# Patient Record
Sex: Male | Born: 1985 | Race: Black or African American | Hispanic: No | Marital: Single | Smoking: Never smoker
Health system: Southern US, Community
[De-identification: ages and names within clinical notes are randomized; demographics above are authoritative.]

---

## 2017-10-22 ENCOUNTER — Other Ambulatory Visit: Payer: Self-pay

## 2017-10-22 ENCOUNTER — Encounter (HOSPITAL_COMMUNITY): Payer: Self-pay | Admitting: Emergency Medicine

## 2017-10-22 ENCOUNTER — Ambulatory Visit (HOSPITAL_COMMUNITY)
Admission: EM | Admit: 2017-10-22 | Discharge: 2017-10-22 | Disposition: A | Discharge status: Home / Self Care | Payer: Self-pay | Attending: Urgent Care | Admitting: Urgent Care

## 2017-10-22 DIAGNOSIS — R21 Rash and other nonspecific skin eruption: Secondary | ICD-10-CM

## 2017-10-22 DIAGNOSIS — L299 Pruritus, unspecified: Secondary | ICD-10-CM

## 2017-10-22 DIAGNOSIS — B86 Scabies: Secondary | ICD-10-CM

## 2017-10-22 MED ORDER — HYDROXYZINE HCL 25 MG PO TABS: 25.0000 mg | ORAL_TABLET | Freq: Four times a day (QID) | ORAL | 0 refills | Status: DC

## 2017-10-22 MED ORDER — IVERMECTIN 3 MG PO TABS: ORAL_TABLET | ORAL | 0 refills | Status: DC

## 2017-10-22 NOTE — ED Provider Notes (Signed)
  MRN: [id number] DOB: 10/03/1985  Subjective:   Cody Oconnell is a 32 y.o. male presenting for 3 day history of itching, rash that started over his right wrist, moved up his forearm. Now he has the rash over his left forearm, torso, lower legs. Denies fever, rash or itching over hands, soles of feet, headache, sore throat, cough, chest tightness, shob, n/v, abdominal pain. Lives with 1 roommate that does not have any symptoms. He does not work outdoors, has not come into contact with poisonous plants, no tick bites. Has not started eating new foods, using new cleaning products or hygiene products. Denies smoking cigarettes or drinking alcohol. He is not currently taking any medications. Patient has No Known Allergies and denies past medical and surgical history.   Objective:   Vitals: BP 134/83 (BP Location: Left Arm)   Pulse 64   Temp 98 F (36.7 C) (Oral)   Resp 18   Wt 142 lb 6.4 oz (64.6 kg)   SpO2 100%   Physical Exam  Constitutional: He is oriented to person, place, and time. He appears well-developed and well-nourished.  HENT:  Mouth/Throat: Oropharynx is clear and moist.  Eyes: Right eye exhibits no discharge. Left eye exhibits no discharge.  Neck: Normal range of motion. Neck supple.  Cardiovascular: Normal rate, regular rhythm and intact distal pulses. Exam reveals no gallop and no friction rub.  No murmur heard. Pulmonary/Chest: No respiratory distress. He has no wheezes. He has no rales.  Lymphadenopathy:    He has no cervical adenopathy.  Neurological: He is alert and oriented to person, place, and time.  Skin: Skin is warm and dry. Rash (multiple linear excoriations and burrows over forearms bilaterally, lower legs and left torso) noted.  Psychiatric: He has a normal mood and affect.   Assessment and Plan :   Scabies  Rash  Itching  Will manage as scabies with Ivermectin. Use hydroxyzine for itching. Return-to-clinic precautions discussed, patient  verbalized understanding.    Wallis BambergMani, Cody Oconnell, [place]PA-C 10/22/17 [id number]

## 2017-10-22 NOTE — ED Triage Notes (Signed)
Rash and itching for 3 days

## 2017-10-30 ENCOUNTER — Other Ambulatory Visit: Payer: Self-pay

## 2017-10-30 ENCOUNTER — Other Ambulatory Visit (HOSPITAL_COMMUNITY)
Admission: RE | Admit: 2017-10-30 | Discharge: 2017-10-30 | Disposition: A | Discharge status: Home / Self Care | Payer: Medicaid Other | Source: Ambulatory Visit | Attending: Family Medicine | Admitting: Family Medicine

## 2017-10-30 ENCOUNTER — Ambulatory Visit (INDEPENDENT_AMBULATORY_CARE_PROVIDER_SITE_OTHER): Payer: Self-pay | Admitting: Family Medicine

## 2017-10-30 VITALS — BP 120/72 | HR 69 | Temp 98.5°F | Ht 72.0 in | Wt 144.4 lb

## 2017-10-30 DIAGNOSIS — Z0289 Encounter for other administrative examinations: Secondary | ICD-10-CM

## 2017-10-30 DIAGNOSIS — Z789 Other specified health status: Secondary | ICD-10-CM | POA: Insufficient documentation

## 2017-10-30 DIAGNOSIS — H547 Unspecified visual loss: Secondary | ICD-10-CM

## 2017-10-30 DIAGNOSIS — R630 Anorexia: Secondary | ICD-10-CM

## 2017-10-30 DIAGNOSIS — R309 Painful micturition, unspecified: Secondary | ICD-10-CM

## 2017-10-30 MED ORDER — AZITHROMYCIN 500 MG PO TABS
1000.0000 mg | ORAL_TABLET | Freq: Once | ORAL | Status: AC
  Administered 2017-10-30: 1000 mg via ORAL

## 2017-10-30 MED ORDER — CEFTRIAXONE SODIUM 250 MG IJ SOLR
250.0000 mg | Freq: Once | INTRAMUSCULAR | Status: AC
  Administered 2017-10-30: 250 mg via INTRAMUSCULAR

## 2017-10-30 NOTE — Patient Instructions (Signed)
Thank you for coming in!  We will do some lab tests today.   We will also treat you for your pain with urination.   We made an follow up appointment for you in two weeks.

## 2017-10-30 NOTE — Progress Notes (Signed)
Kinyarwanda interpreter [place] [phone]) utilized during today's visit. Interpreter present for entire patient encounter.   [facility]  HPI:  Patient presents to [facility] today for a new patient appointment to establish general primary care, also to discuss dysuria, blurred vision, decreased appetite.   1. Dysuria: reports of dysuria for 2-3 months. This is intermittent. Denies hematuria. No fevers or chills. No abdominal pain. Feels like he empties his bladder well. No penile discharge. Is sexually active with women. Uses condoms irregularly   2. Blurred Vision: this is a chronic issue. Has difficulty seeing far away. More recently, has difficulty seeing the board at school. Does not have glasses. Used his teacher's glasses once and was able to see better. Denies any eye pain or floaters.   3. Decreased Appetite: reports that he does not usually eat much in general. This has been ongoing for years. Denies nausea, burning sensation in his chest, abdominal pain, constipation. Denies feelings of depression or anhedonia. Does not think he has lost weight. No night sweats.   ROS:  otherwise see HPI  Past Medical Hx:  - none  Past Surgical Hx:  - none  Family Hx: updated in Epic - Number of family members:  5 - Number of family members in [place]:  4  Immigrant Social History: - Date arrived in [place]: March 2019 - Country of origin: [place] - Location of refugee camp (if applicable), how long there, and what caused patient to leave home country?: [place] since 1996 due to war in [place] - Primary language: Kinyarwanda  -Requires intepreter (essentially speaks no [place]) - Education: Highest level of education: primary school. His lost his father at a young age. He had to take care of his family.  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  no    Preventative Care History: -Seen at health department?: no  PHYSICAL EXAM: BP 120/72   Pulse 69   Temp 98.5 F (36.9 C) (Oral)   Ht 6' (1.829 m)   Wt 144 lb 6.4 oz (65.5 kg)   SpO2 99%   BMI 19.58 kg/m  Gen: NAD, pleasant  HEENT: Atraumatic, normocephalic, neck supple, EOMI, conjunctiva mildly injected bilaterally, PERRL. Oropharynx normal.  Neck:  Supple and no lymphadenopathy  Heart: RRR, no murmurs, rubs, or gallops Lungs: CTAB, normal effort  ABD: Soft, nontender, nondistended, NABS, no organomegaly GU: uncircumcised; normal anatomy. No abnormalities  Skin:  No rash  MSK: able to move all extremities without difficulty  Neuro: Awake, alert, no focal deficits grossly, normal speech Psych: Mood and affect euthymic, normal rate and volume of speech  Examined and interviewed with Dr. Gwendolyn Grant  1. Refugee health examination - Comprehensive metabolic panel - CBC with Differential/Platelet - Urine cytology ancillary only - Varicella zoster antibody, IgG - Hepatitis B surface antigen - Hepatitis C antibody - QuantiFERON-TB Gold Plus - Sickle cell screen - RPR - HIV antibody (with reflex)  2. Language barrier  3. Urinary pain Will obtain UA and Gc/Chlamydia testing as noted above. Likely STI related therefore will treat empirically with Azithromycin 1g PO X1  and Ceftriaxone 250mg  IM x 1. Follow up appointment made to review labs.  - azithromycin (ZITHROMAX) tablet 1,000 mg - cefTRIAXone (ROCEPHIN) injection 250 mg  4.  Decreased Vision:  Visual acuity 20/30. Patient reports he is failure certain he has an appointment with eye doctor. He will let us know.   5. Decreased Appetite:  Appears to be chronic. Exam is unremarkable. Will obtain basic labs as noted above. Does not seem to be related to mood, GERD, gastritis, or constipation.

## 2017-10-31 LAB — URINE CYTOLOGY ANCILLARY ONLY
Chlamydia: NEGATIVE
Neisseria Gonorrhea: NEGATIVE

## 2017-11-02 ENCOUNTER — Encounter: Payer: Self-pay | Admitting: Family Medicine

## 2017-11-02 LAB — CBC WITH DIFFERENTIAL/PLATELET
BASOS ABS: 0 10*3/uL (ref 0.0–0.2)
BASOS: 0 %
EOS (ABSOLUTE): 0.1 10*3/uL (ref 0.0–0.4)
Eos: 2 %
Hematocrit: 47.1 % (ref 37.5–51.0)
Hemoglobin: 16.5 g/dL (ref 13.0–17.7)
IMMATURE GRANS (ABS): 0 10*3/uL (ref 0.0–0.1)
Immature Granulocytes: 1 %
LYMPHS: 41 %
Lymphocytes Absolute: 2.5 10*3/uL (ref 0.7–3.1)
MCH: 30.2 pg (ref 26.6–33.0)
MCHC: 35 g/dL (ref 31.5–35.7)
MCV: 86 fL (ref 79–97)
MONOS ABS: 0.3 10*3/uL (ref 0.1–0.9)
Monocytes: 6 %
NEUTROS ABS: 3 10*3/uL (ref 1.4–7.0)
NEUTROS PCT: 50 %
Platelets: 259 10*3/uL (ref 150–379)
RBC: 5.47 x10E6/uL (ref 4.14–5.80)
RDW: 14.2 % (ref 12.3–15.4)
WBC: 6 10*3/uL (ref 3.4–10.8)

## 2017-11-02 LAB — COMPREHENSIVE METABOLIC PANEL
A/G RATIO: 1.6 (ref 1.2–2.2)
ALT: 29 IU/L (ref 0–44)
AST: 21 IU/L (ref 0–40)
Albumin: 4.6 g/dL (ref 3.5–5.5)
Alkaline Phosphatase: 71 IU/L (ref 39–117)
BILIRUBIN TOTAL: 0.6 mg/dL (ref 0.0–1.2)
BUN/Creatinine Ratio: 11 (ref 9–20)
BUN: 10 mg/dL (ref 6–20)
CHLORIDE: 101 mmol/L (ref 96–106)
CO2: 25 mmol/L (ref 20–29)
Calcium: 9.3 mg/dL (ref 8.7–10.2)
Creatinine, Ser: 0.93 mg/dL (ref 0.76–1.27)
GFR, EST AFRICAN AMERICAN: 127 mL/min/{1.73_m2} (ref >59)
GFR, EST NON AFRICAN AMERICAN: 110 mL/min/{1.73_m2} (ref >59)
GLOBULIN, TOTAL: 2.9 g/dL (ref 1.5–4.5)
Glucose: 99 mg/dL (ref 65–99)
Potassium: 4.5 mmol/L (ref 3.5–5.2)
SODIUM: 139 mmol/L (ref 134–144)
Total Protein: 7.5 g/dL (ref 6.0–8.5)

## 2017-11-02 LAB — HEPATITIS B SURFACE ANTIGEN: HEP B S AG: NEGATIVE

## 2017-11-02 LAB — QUANTIFERON-TB GOLD PLUS
QuantiFERON Mitogen Value: >10 IU/mL
QuantiFERON Nil Value: 5.84 IU/mL
QuantiFERON-TB Gold Plus: POSITIVE — AB

## 2017-11-02 LAB — VARICELLA ZOSTER ANTIBODY, IGG: VARICELLA: 1361 {index} (ref >165)

## 2017-11-02 LAB — SICKLE CELL SCREEN: Sickle Cell Screen: NEGATIVE

## 2017-11-02 LAB — HEPATITIS C ANTIBODY

## 2017-11-02 LAB — HIV ANTIBODY (ROUTINE TESTING W REFLEX): HIV Screen 4th Generation wRfx: NONREACTIVE

## 2017-11-02 LAB — RPR: RPR Ser Ql: NONREACTIVE

## 2017-11-02 NOTE — Progress Notes (Unsigned)
Patient with positive quantiferon-gold.  Needs to have this letter faxed to health department at TB clinic fax number:  [phone].  Please fax this to the above number.  He also needs a face-sheet with his DOB included. Thanks!

## 2017-11-13 ENCOUNTER — Ambulatory Visit: Payer: Self-pay | Admitting: Internal Medicine

## 2017-11-13 NOTE — Progress Notes (Deleted)
   [facility] Phone: [phone]   Date of Visit: 11/13/2017   HPI:  ***  ROS: See HPI.  PMFSH: ***  PHYSICAL EXAM: There were no vitals taken for this visit. Gen: *** HEENT: *** Heart: *** Lungs: *** Neuro: *** Ext: ***  ASSESSMENT/PLAN:  Health maintenance:  -***  No problem-specific Assessment & Plan notes found for this encounter.  FOLLOW UP: Follow up in *** for ***  Palma Holter, MD PGY 2 [facility] '

## 2017-11-21 ENCOUNTER — Other Ambulatory Visit: Payer: Self-pay

## 2017-11-21 ENCOUNTER — Ambulatory Visit (INDEPENDENT_AMBULATORY_CARE_PROVIDER_SITE_OTHER): Payer: Medicaid Other | Admitting: Internal Medicine

## 2017-11-21 ENCOUNTER — Encounter: Payer: Self-pay | Admitting: Internal Medicine

## 2017-11-21 VITALS — BP 110/70 | HR 58 | Temp 98.4°F | Ht 72.0 in | Wt 143.0 lb

## 2017-11-21 DIAGNOSIS — J302 Other seasonal allergic rhinitis: Secondary | ICD-10-CM

## 2017-11-21 DIAGNOSIS — Z789 Other specified health status: Secondary | ICD-10-CM | POA: Diagnosis not present

## 2017-11-21 DIAGNOSIS — R7611 Nonspecific reaction to tuberculin skin test without active tuberculosis: Secondary | ICD-10-CM | POA: Diagnosis not present

## 2017-11-21 DIAGNOSIS — R12 Heartburn: Secondary | ICD-10-CM | POA: Diagnosis not present

## 2017-11-21 DIAGNOSIS — Z227 Latent tuberculosis: Secondary | ICD-10-CM

## 2017-11-21 MED ORDER — CETIRIZINE HCL 10 MG PO TABS: 10.0000 mg | ORAL_TABLET | Freq: Every day | ORAL | 0 refills | Status: AC

## 2017-11-21 MED ORDER — RANITIDINE HCL 75 MG PO TABS: 75.0000 mg | ORAL_TABLET | Freq: Two times a day (BID) | ORAL | 0 refills | Status: AC

## 2017-11-21 NOTE — Progress Notes (Signed)
   [facility] Phone: [phone]   Date of Visit: 11/21/2017   HPI:  Follow up to discuss labs:  - we discussed his positive quant gold testing. He denies any cough, blood in sputum, or night sweats.  - reports that when he was in his country, he had a room mate who was having cough and night sweats.  - he did not hear from the health department regarding this.   Heart Burn:  - patient reports that he sometimes has burning in his chest sometimes after he eats  - he denies any epigastric pain but reports he had some abdominal pain when he was a kid  Sneezing:  - reports that for the past few weeks he has been having sneezing, itchy nose, and itchy eyes. His throat is itchy  - no fevers or chills - no sick contacts   Uncircumcised Male:  - at the initial visit, patient reported that he desires to have circumcision   ROS: See HPI.  PMFSH:  PMH:  Latent TB  PHYSICAL EXAM: BP 110/70   Pulse (!) 58   Temp 98.4 F (36.9 C) (Oral)   Ht 6' (1.829 m)   Wt 143 lb (64.9 kg)   SpO2 99%   BMI 19.39 kg/m  GEN: NAD HEENT: Atraumatic, normocephalic, neck supple, EOMI, sclera clear, pharynx is mildly erythematous without exudates. Nasal turbinates erythematous   CV: RRR, no murmurs, rubs, or gallops PULM: CTAB, normal effort SKIN: No rash or cyanosis; warm and well-perfused EXTR: No lower extremity edema or calf tenderness PSYCH: Mood and affect euthymic, normal rate and volume of speech NEURO: Awake, alert, no focal deficits grossly, normal speech;  ASSESSMENT/PLAN:  1. Latent tuberculosis by blood test Discussed with Dr. Gwendolyn Grant. No symptoms of active TB. Dr. Gwendolyn Grant to resend letter to health department.   2. Uncircumcised male Patient desires to have circumcision.  - Ambulatory referral to Urology  3. Heart burn No red flags. No signs of PUD. Trial of Zantac  BID. Follow up in 3-4 weeks.   4. Seasonal allergies No signs of infectious  process. Trial of Zyrtec  q hs. Follow up in 3-4 weeks.   Palma Holter, MD PGY 3 [facility]]

## 2017-11-21 NOTE — Progress Notes (Signed)
Printed letter with lab results of + quant gold to send to Health Department re: latent TB.  Unclear if they received prior letter.  Placed in "to be faxed" box.

## 2017-11-21 NOTE — Patient Instructions (Addendum)
1) I will refer you to the urology for the procedure  2) Please try Zantac 1 tablet twice a day for heart burn.  3) I prescribed Zyrtec 1 tablet at night for allergies  4) Follow up in 3-4 weeks

## 2017-12-18 ENCOUNTER — Ambulatory Visit: Payer: Medicaid Other | Admitting: Family Medicine

## 2018-02-19 ENCOUNTER — Other Ambulatory Visit: Payer: Self-pay

## 2018-02-19 ENCOUNTER — Encounter: Payer: Self-pay | Admitting: Family Medicine

## 2018-02-19 ENCOUNTER — Ambulatory Visit (INDEPENDENT_AMBULATORY_CARE_PROVIDER_SITE_OTHER): Payer: Medicaid Other | Admitting: Family Medicine

## 2018-02-19 VITALS — BP 122/68 | HR 50 | Temp 97.5°F | Ht 72.0 in | Wt 154.0 lb

## 2018-02-19 DIAGNOSIS — K648 Other hemorrhoids: Secondary | ICD-10-CM

## 2018-02-19 DIAGNOSIS — M62838 Other muscle spasm: Secondary | ICD-10-CM

## 2018-02-19 DIAGNOSIS — R7611 Nonspecific reaction to tuberculin skin test without active tuberculosis: Secondary | ICD-10-CM | POA: Diagnosis not present

## 2018-02-19 DIAGNOSIS — Z227 Latent tuberculosis: Secondary | ICD-10-CM

## 2018-02-19 DIAGNOSIS — R0689 Other abnormalities of breathing: Secondary | ICD-10-CM

## 2018-02-19 MED ORDER — HYDROCORTISONE ACETATE 25 MG RE SUPP: 25.0000 mg | Freq: Two times a day (BID) | RECTAL | 0 refills | Status: AC

## 2018-02-19 NOTE — Progress Notes (Signed)
Subjective:    Cody Oconnell is a 32 y.o. male [facility] and other concerns:  1.  Right shoulder pain:  Present for about a week.  Worsened while he is at work.  He works at [employer].  He is right handed.  Aching pain in trapezius region on Right side.  No falls.  No injuries.  Hasn't tried anything for relief.  No radiation.  No neck pain.  2.  Blood when wiping:  Intermittent, though most days.  Present when wiping.  Noted for past several weeks.  Never any blood in toilet bowel.  Has had trouble with constipation.  Also itching in rectum.  Doesn't know what to do for relief.  No lightheadedness.  3.  Breathing trouble:  Occurs at work.  Works in ~45 degree room at [employer].  Was told it could be secondary to energy drinks he uses at work.  Only occurs in cold at work.  Never with other exertions.  No chest pain.  No cough.  No fever/chills.  Hasn't tried anything for relief.    ROS as above per HPI.   The following portions of the patient's history were reviewed and updated as appropriate: allergies, current medications, past medical history, family and social history, and problem list. Patient is a nonsmoker.    PMH reviewed.  No past medical history on file. No past surgical history on file.  Medications reviewed. Current Outpatient Medications  Medication Sig Dispense Refill  . cetirizine (ZYRTEC) 10 MG tablet Take 1 tablet (10 mg total) by mouth daily. 30 tablet 0  . ranitidine (ZANTAC) 75 MG tablet Take 1 tablet (75 mg total) by mouth 2 (two) times daily. 60 tablet 0   No current facility-administered medications for this visit.      Objective:   Physical Exam BP 122/68   Pulse (!) 50   Temp (!) 97.5 F (36.4 C) (Oral)   Ht 6' (1.829 m)   Wt 154 lb (69.9 kg)   SpO2 99%   BMI 20.89 kg/m  Gen:  Alert, cooperative patient who appears stated age in no acute distress.  Vital signs reviewed. HEENT: EOMI,   MMM Cardiac:  Regular rate and rhythm without murmur auscultated.  Good S1/S2. Pulm:  Clear to auscultation bilaterally  MSK:  Tender along trapezius and scapular region on Right.  No muscular weakness.  Palpable spasm noted.  NO neck stiffness. Rectal:  No external hemorrhoids or fissures noted.    No results found for this or any previous visit (from the past 72 hour(s)).

## 2018-02-19 NOTE — Patient Instructions (Addendum)
It was good to see you again today.  Use the Anusol twice daily for 5 days.  This is for hemorrhoids.  It should make the bleeding stop.  You have a muscle spasm in your neck.  Use the Ben-gay cream (over the counter) as a pain reliever.  You can also use over-the-counter ibuprofen as needed.  On your way out today schedule appointment with pharmacy clinic for pulmonary function testing.  This will help [place] determine what is going on with your lungs and your trouble breathing.   Hemorrhoids Hemorrhoids are swollen veins in and around the rectum or anus. There are two types of hemorrhoids:  Internal hemorrhoids. These occur in the veins that are just inside the rectum. They may poke through to the outside and become irritated and painful.  External hemorrhoids. These occur in the veins that are outside of the anus and can be felt as a painful swelling or hard lump near the anus.  Most hemorrhoids do not cause serious problems, and they can be managed with home treatments such as diet and lifestyle changes. If home treatments do not help your symptoms, procedures can be done to shrink or remove the hemorrhoids. What are the causes? This condition is caused by increased pressure in the anal area. This pressure may result from various things, including:  Constipation.  Straining to have a bowel movement.  Diarrhea.  Pregnancy.  Obesity.  Sitting for long periods of time.  Heavy lifting or other activity that causes you to strain.  Anal sex.  What are the signs or symptoms? Symptoms of this condition include:  Pain.  Anal itching or irritation.  Rectal bleeding.  Leakage of stool (feces).  Anal swelling.  One or more lumps around the anus.  How is this diagnosed? This condition can often be diagnosed through a visual exam. Other exams or tests may also be done, such as:  Examination of the rectal area with a gloved hand (digital rectal exam).  Examination of the anal  canal using a small tube (anoscope).  A blood test, if you have lost a significant amount of blood.  A test to look inside the colon (sigmoidoscopy or colonoscopy).  How is this treated? This condition can usually be treated at home. However, various procedures may be done if dietary changes, lifestyle changes, and other home treatments do not help your symptoms. These procedures can help make the hemorrhoids smaller or remove them completely. Some of these procedures involve surgery, and others do not. Common procedures include:  Rubber band ligation. Rubber bands are placed at the base of the hemorrhoids to cut off the blood supply to them.  Sclerotherapy. Medicine is injected into the hemorrhoids to shrink them.  Infrared coagulation. A type of light energy is used to get rid of the hemorrhoids.  Hemorrhoidectomy surgery. The hemorrhoids are surgically removed, and the veins that supply them are tied off.  Stapled hemorrhoidopexy surgery. A circular stapling device is used to remove the hemorrhoids and use staples to cut off the blood supply to them.  Follow these instructions at home: Eating and drinking  Eat foods that have a lot of fiber in them, such as whole grains, beans, nuts, fruits, and vegetables. Ask your health care provider about taking products that have added fiber (fiber supplements).  Drink enough fluid to keep your urine clear or pale yellow. Managing pain and swelling  Take warm sitz baths for 20 minutes, 3-4 times a day to ease pain and  discomfort.  If directed, apply ice to the affected area. Using ice packs between sitz baths may be helpful. ? Put ice in a plastic bag. ? Place a towel between your skin and the bag. ? Leave the ice on for 20 minutes, 2-3 times a day. General instructions  Take over-the-counter and prescription medicines only as told by your health care provider.  Use medicated creams or suppositories as told.  Exercise regularly.  Go  to the bathroom when you have the urge to have a bowel movement. Do not wait.  Avoid straining to have bowel movements.  Keep the anal area dry and clean. Use wet toilet paper or moist towelettes after a bowel movement.  Do not sit on the toilet for long periods of time. This increases blood pooling and pain. Contact a health care provider if:  You have increasing pain and swelling that are not controlled by treatment or medicine.  You have uncontrolled bleeding.  You have difficulty having a bowel movement, or you are unable to have a bowel movement.  You have pain or inflammation outside the area of the hemorrhoids. This information is not intended to replace advice given to you by your health care provider. Make sure you discuss any questions you have with your health care provider. Document Released: 06/30/2000 Document Revised: 12/01/2015 Document Reviewed: 03/17/2015 Elsevier Interactive Patient Education  [employer]2018 Elsevier Inc.

## 2018-02-20 ENCOUNTER — Encounter: Payer: Self-pay | Admitting: Family Medicine

## 2018-02-20 DIAGNOSIS — K648 Other hemorrhoids: Secondary | ICD-10-CM | POA: Insufficient documentation

## 2018-02-20 DIAGNOSIS — R0689 Other abnormalities of breathing: Secondary | ICD-10-CM | POA: Insufficient documentation

## 2018-02-20 DIAGNOSIS — M62838 Other muscle spasm: Secondary | ICD-10-CM | POA: Insufficient documentation

## 2018-02-20 NOTE — Assessment & Plan Note (Signed)
Reports not having been contacted by the health department.

## 2018-02-20 NOTE — Assessment & Plan Note (Signed)
Possibility of cold-induced bronchospasm.   Lungs clear on exam today.  Referral to Pharmacy clinic for spirometry testing.  Patient will make an appointment on the way out.

## 2018-02-20 NOTE — Assessment & Plan Note (Signed)
Treat with Anusol.  Mild bleeding.  FU in 1 month to assess for improvement.

## 2018-02-28 ENCOUNTER — Encounter: Payer: Self-pay | Admitting: Pharmacist

## 2018-02-28 ENCOUNTER — Ambulatory Visit: Payer: Medicaid Other | Admitting: Pharmacist

## 2018-02-28 DIAGNOSIS — R0689 Other abnormalities of breathing: Secondary | ICD-10-CM

## 2018-02-28 NOTE — Assessment & Plan Note (Signed)
Patient has been experiencing dyspnea and chest tightness while at work and when in confined spaces (for example crowded vehicles).  Spirometry evaluation with pre- and post-bronchodilator reveals near normal lung function. Lean body habitus likely explains why lung function <90% of predicted. - Discussed results with PCP Dr. Gwendolyn GrantWalden. Recommended patient ask for breathing mask that may help warm the cold air at his workplace. No medication initiated at this visit. Asked to return for further evaluation if breathing worsens for symptoms increase in frequency.

## 2018-02-28 NOTE — Progress Notes (Signed)
   S:    Patient arrives in good spirits, ambulating without assistance.    Presents for lung function evaluation.  Patient was referred by PCP Dr. Gwendolyn GrantWalden at last appointment on 02/19/2018. Patient had complained of chest tightness while at work (works in [employer] [place] in cold temperatures).  Patient reports breathing has been good recently. Denies any problems with allergies historically, and denies ever being told he has asthma. He notes that he had breathing problems years ago when in confined spaces (in a crowded car), but does not recall any other exacerbating factors. Denies waking up in the middle of the night with symptoms.  O: Physical Exam  Constitutional: He appears well-developed and well-nourished.  Vitals reviewed.  Review of Systems  All other systems reviewed and are negative.   See "scanned report" or Documentation Flowsheet (discrete results - PFTs) for  Spirometry results. Patient provided good effort while attempting spirometry.    A/P: Patient has been experiencing dyspnea and chest tightness while at work and when in confined spaces (for example crowded vehicles).  Spirometry evaluation with pre- and post-bronchodilator reveals near normal lung function. Lean body habitus likely explains why lung function <90% of predicted. - Discussed results with PCP Dr. Gwendolyn GrantWalden. Recommended patient ask for breathing mask that may help warm the cold air at his workplace. No medication initiated at this visit. Asked to return for further evaluation if breathing worsens for symptoms increase in frequency.    Patient verbalized understanding of results and education.  Written pt instructions provided.   F/U with PCP.   Total time in face to face counseling 30 minutes.  Patient seen with Caffie PintoAkshara Kumar, PharmD Candidate, Gwynneth AlbrightSara Nimer, PharmD, PGY1 Pharmacy Resident, and Catie Feliz Beamravis, PharmD,  PGY2 Pharmacy Resident.

## 2018-02-28 NOTE — Patient Instructions (Addendum)
Nice to meet you today.   Your lung function test showed NORMAL airflow.  Your lungs today are healthy appearing.    Like Dr. Gwendolyn GrantWalden said, use the suppositories (bullets) twice daily for 5 days. If your symptoms don't improve a week after that, schedule an appointment with Dr. Gwendolyn GrantWalden.

## 2018-03-01 NOTE — Progress Notes (Signed)
Patient ID: Cody Oconnell, male   DOB: 01/07/1986, 32 y.o.   MRN: [id number] Reviewed: Agree with Dr. Macky LowerKoval's documentation and management.

## 2018-03-20 ENCOUNTER — Telehealth: Payer: Self-pay | Admitting: Family Medicine

## 2018-03-20 NOTE — Telephone Encounter (Signed)
Pt would like Dr. Gwendolyn Grant to call him.

## 2018-03-21 NOTE — Telephone Encounter (Signed)
Please call patient and ask what he needs/clarify the reason for the phone call.  It might be as simple as he wants to make an appointment.  Thank you

## 2018-04-26 ENCOUNTER — Other Ambulatory Visit: Payer: Self-pay | Admitting: Internal Medicine

## 2018-04-26 ENCOUNTER — Ambulatory Visit
Admission: RE | Admit: 2018-04-26 | Discharge: 2018-04-26 | Disposition: A | Discharge status: Home / Self Care | Payer: No Typology Code available for payment source | Source: Ambulatory Visit | Attending: Internal Medicine | Admitting: Internal Medicine

## 2018-04-26 DIAGNOSIS — Z111 Encounter for screening for respiratory tuberculosis: Secondary | ICD-10-CM

## 2018-05-24 ENCOUNTER — Ambulatory Visit: Payer: Medicaid Other | Admitting: Family Medicine

## 2018-11-06 ENCOUNTER — Other Ambulatory Visit: Payer: Self-pay

## 2018-11-06 ENCOUNTER — Ambulatory Visit (HOSPITAL_COMMUNITY)
Admission: EM | Admit: 2018-11-06 | Discharge: 2018-11-06 | Disposition: A | Discharge status: Home / Self Care | Payer: BLUE CROSS/BLUE SHIELD | Attending: Family Medicine | Admitting: Family Medicine

## 2018-11-06 DIAGNOSIS — B349 Viral infection, unspecified: Secondary | ICD-10-CM | POA: Diagnosis not present

## 2018-11-06 DIAGNOSIS — R197 Diarrhea, unspecified: Secondary | ICD-10-CM

## 2018-11-06 MED ORDER — IBUPROFEN 800 MG PO TABS: 800.0000 mg | ORAL_TABLET | Freq: Three times a day (TID) | ORAL | 0 refills | Status: AC

## 2018-11-06 MED ORDER — BENZONATATE 100 MG PO CAPS: 100.0000 mg | ORAL_CAPSULE | Freq: Three times a day (TID) | ORAL | 0 refills | Status: AC

## 2018-11-06 NOTE — ED Triage Notes (Signed)
Pt cc cough and diarrhea x 3 days. Pt has had a fever and its gone away now. Pt states that he has not been able to eat.

## 2018-11-09 NOTE — ED Provider Notes (Signed)
[facility]   [id number] 11/06/18 Arrival Time: 1459  ASSESSMENT & PLAN:  1. Viral illness   2. Diarrhea, unspecified type    Work note provided. With some respiratory symptoms, recommend staying out of work for one week.  Meds ordered this encounter  Medications  . benzonatate (TESSALON) 100 MG capsule    Sig: Take 1 capsule (100 mg total) by mouth every 8 (eight) hours.    Dispense:  21 capsule    Refill:  0  . ibuprofen (ADVIL) 800 MG tablet    Sig: Take 1 tablet (800 mg total) by mouth 3 (three) times daily with meals.    Dispense:  21 tablet    Refill:  0   Please do your best to ensure adequate fluid intake in order to avoid dehydration. If you find that you are unable to tolerate drinking fluids regularly please proceed to the Emergency Department for evaluation.  Also, you should return to the hospital if you experience persistent fevers for greater than 1-2 more days, increasing abdominal pain that persists despite medications, persistent diarrhea, dizziness, syncope (fainting), or for any other concerns you may feel are worrisome.  Follow-up Information    Tobey Grim, MD.   Specialty:  Family Medicine Why:  As needed. Contact information: [address] [place] [address] [phone]        [facility].   Specialty:  Urgent Care Why:  As needed. Contact information: [address] [place] [address] [phone]          Reviewed expectations re: course of current medical issues. Questions answered. Outlined signs and symptoms indicating need for more acute intervention. Patient verbalized understanding. After Visit Summary given.   SUBJECTIVE: History from: patient.  Cody Oconnell is a 33 y.o. male who presents with complaint of mild nasal congestion, post-nasal drainage, and a persistent dry cough; without sore throat. Onset abrupt, approx 4 days ago; with  fatigue fatigue and without body aches. SOB: none. Wheezing: none. Fever: questions subjective. Overall normal PO intake without n/v. Known sick contacts: possible co-workers; works at a [employer]. No specific or significant aggravating or alleviating factors reported. OTC treatment: none reported. Also reports sporadic loose stools/diarrhea for the past two days; non-bloody. No abdominal pain. No back pain. No urinary symptoms.  Social History   Tobacco Use  Smoking Status Never Smoker  Smokeless Tobacco Never Used    ROS: As per HPI. All other systems negative.    OBJECTIVE:  Vitals:   11/06/18 1519  BP: (!) 152/84  Pulse: 89  Resp: 18  Temp: 99.3 F (37.4 C)  TempSrc: Oral  SpO2: 100%     General appearance: alert; appears fatigued HEENT: nasal congestion; clear runny nose; throat irritation secondary to post-nasal drainage Neck: supple without LAD CV: RRR Lungs: unlabored respirations, symmetrical air entry without wheezing; cough: mild and dry Abd: soft; non-tender; normal bowel sounds; no guarding Ext: no LE edema Skin: warm and dry Psychological: alert and cooperative; normal mood and affect   No Known Allergies  PMH: Occasional skin rashes.  Social History   Socioeconomic History  . Marital status: Single    Spouse name: Not on file  . Number of children: Not on file  . Years of education: Not on file  . Highest education level: Not on file  Occupational History  . Not on file  Social Needs  . Financial resource strain: Not on file  .  Food insecurity:    Worry: Not on file    Inability: Not on file  . Transportation needs:    Medical: Not on file    Non-medical: Not on file  Tobacco Use  . Smoking status: Never Smoker  . Smokeless tobacco: Never Used  Substance and Sexual Activity  . Alcohol use: Never    Frequency: Never  . Drug use: Never  . Sexual activity: Not on file  Lifestyle  . Physical activity:    Days per week: Not on file     Minutes per session: Not on file  . Stress: Not on file  Relationships  . Social connections:    Talks on phone: Not on file    Gets together: Not on file    Attends religious service: Not on file    Active member of club or organization: Not on file    Attends meetings of clubs or organizations: Not on file    Relationship status: Not on file  . Intimate partner violence:    Fear of current or ex partner: Not on file    Emotionally abused: Not on file    Physically abused: Not on file    Forced sexual activity: Not on file  Other Topics Concern  . Not on file  Social History Narrative   Immigrant Social History:   - Date arrived in [place]: March 2019   - Country of origin: [place]   - Location of refugee camp (if applicable), how long there, and what caused patient to leave home country?: [place] since 1996 due to war in [place]   - Primary language: Kinyar[place]    -Requires intepreter (essentially speaks no [place])   - Education: Highest level of education: primary school. His lost his father at a young age. He had to take care of his family.    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  no           Mardella LaymanHagler, Otniel Hoe, MD 11/09/18 1009

## 2018-11-11 ENCOUNTER — Other Ambulatory Visit: Payer: Self-pay

## 2018-11-11 ENCOUNTER — Ambulatory Visit (HOSPITAL_COMMUNITY)
Admission: EM | Admit: 2018-11-11 | Discharge: 2018-11-11 | Disposition: A | Discharge status: Home / Self Care | Payer: BLUE CROSS/BLUE SHIELD | Attending: Family Medicine | Admitting: Family Medicine

## 2018-11-11 ENCOUNTER — Encounter (HOSPITAL_COMMUNITY): Payer: Self-pay | Admitting: Emergency Medicine

## 2018-11-11 ENCOUNTER — Ambulatory Visit (INDEPENDENT_AMBULATORY_CARE_PROVIDER_SITE_OTHER): Payer: BLUE CROSS/BLUE SHIELD

## 2018-11-11 DIAGNOSIS — J181 Lobar pneumonia, unspecified organism: Secondary | ICD-10-CM | POA: Diagnosis not present

## 2018-11-11 DIAGNOSIS — J189 Pneumonia, unspecified organism: Secondary | ICD-10-CM

## 2018-11-11 MED ORDER — AZITHROMYCIN 250 MG PO TABS: ORAL_TABLET | ORAL | 0 refills | Status: AC

## 2018-11-11 MED ORDER — AMOXICILLIN 500 MG PO CAPS: 1000.0000 mg | ORAL_CAPSULE | Freq: Three times a day (TID) | ORAL | 0 refills | Status: AC

## 2018-11-11 MED ORDER — ONDANSETRON 4 MG PO TBDP: 4.0000 mg | ORAL_TABLET | Freq: Three times a day (TID) | ORAL | 0 refills | Status: AC | PRN

## 2018-11-11 NOTE — ED Triage Notes (Signed)
Pt returning for continued loss of appetite and cough.  Pt does not have a thermometer so does not know if he has had a fever and he states the diarrhea has resolved.

## 2018-11-11 NOTE — Discharge Instructions (Addendum)
Complete course of antibiotics.  May use previously prescribed tessalon as needed for cough.  Tylenol and/or ibuprofen as needed for pain or fevers.  Please stay out of work for another 1 week.  If develop worsening of shortness of breath , chest pain , weakness, dehydration or otherwise worsening please go to the ER.

## 2018-11-11 NOTE — ED Provider Notes (Signed)
[facility]    CSN: [id number] Arrival date & time: 11/11/18  8563     History   Chief Complaint Chief Complaint  Patient presents with  . Cough  . Anorexia    HPI Cody Oconnell is a 33 y.o. male.   Nolon Lennert presents with complaints of persistent cough and decreased appetite. Symptoms started approximately 1 week ago, had diarrhea originally as well. Diarrhea has subsided. Last night vomited x1 which felt relieving to him. Smells are nauseating. Still has been eating some fruits and drinking fluids. No abdominal pain. Occasional body aches but no specific known fevers. No headache. Was seen here 4/22 and given tessalon. He states this provides minimal help. No other medications for symptoms. No specific known ill contacts. No other URI symptoms. Per chart review has had spirometry testing in the past related to shortness of breath. He works at [employer]. No previous travel.     ROS per HPI, negative if not otherwise mentioned.      History reviewed. No pertinent past medical history.  Patient Active Problem List   Diagnosis Date Noted  . Muscle spasm 02/20/2018  . Internal hemorrhoid 02/20/2018  . Breathing difficult 02/20/2018  . 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 Language barrier 10/30/2017  . Refugee health examination 10/30/2017    History reviewed. No pertinent surgical history.     Home Medications    Prior to Admission medications   Medication Sig Start Date End Date Taking? Authorizing Provider  benzonatate (TESSALON) 100 MG capsule Take 1 capsule (100 mg total) by mouth every 8 (eight) hours. 11/06/18  Yes Mardella Layman, MD  ibuprofen (ADVIL) 800 MG tablet Take 1 tablet (800 mg total) by mouth 3 (three) times daily with meals. 11/06/18  Yes Hagler, Arlys John, MD  amoxicillin (AMOXIL) 500 MG capsule Take 2 capsules (1,000 mg total) by mouth 3 (three) times daily for 7 days. 11/11/18 11/18/18  Georgetta Haber,  NP  azithromycin (ZITHROMAX) 250 MG tablet Take 2 tablets (500 mg total) by mouth daily for 1 day, THEN 1 tablet (250 mg total) daily for 4 days. 11/11/18 11/16/18  Georgetta Haber, NP  cetirizine (ZYRTEC) 10 MG tablet Take 1 tablet (10 mg total) by mouth daily. Patient not taking: Reported on 02/28/2018 11/21/17   Palma Holter, MD  hydrocortisone (ANUSOL-HC) 25 MG suppository Place 1 suppository (25 mg total) rectally 2 (two) times daily. X 5 days 02/19/18   Tobey Grim, MD  ondansetron (ZOFRAN-ODT) 4 MG disintegrating tablet Take 1 tablet (4 mg total) by mouth every 8 (eight) hours as needed for nausea or vomiting. 11/11/18   Georgetta Haber, NP  ranitidine (ZANTAC) 75 MG tablet Take 1 tablet (75 mg total) by mouth 2 (two) times daily. Patient not taking: Reported on 02/28/2018 11/21/17   Palma Holter, MD    Family History History reviewed. No pertinent family history.  Social History Social History   Tobacco Use  . Smoking status: Never Smoker  . Smokeless tobacco: Never Used  Substance Use Topics  . Alcohol use: Never    Frequency: Never  . Drug use: Never     Allergies   Patient has no known allergies.   Review of Systems Review of Systems   Physical Exam Triage Vital Signs ED Triage Vitals  Enc Vitals Group     BP 11/11/18 0941 131/80     Pulse Rate 11/11/18 0941 73     Resp --  Temp 11/11/18 0941 97.9 F (36.6 C)     Temp Source 11/11/18 0941 Oral     SpO2 11/11/18 0941 100 %     Weight --      Height --      Head Circumference --      Peak Flow --      Pain Score 11/11/18 0940 4     Pain Loc --      Pain Edu? --      Excl. in GC? --    No data found.  Updated Vital Signs BP 131/80 (BP Location: Right Arm)   Pulse 73   Temp 97.9 F (36.6 C) (Oral)   SpO2 100%    Physical Exam Constitutional:      Appearance: He is well-developed.  Cardiovascular:     Rate and Rhythm: Normal rate and regular rhythm.  Pulmonary:     Effort:  Pulmonary effort is normal. No respiratory distress.     Breath sounds: Normal breath sounds.  Abdominal:     General: There is no distension.     Tenderness: There is no abdominal tenderness. There is no guarding or rebound.  Skin:    General: Skin is warm and dry.  Neurological:     Mental Status: He is alert and oriented to person, place, and time.      UC Treatments / Results  Labs (all labs ordered are listed, but only abnormal results are displayed) Labs Reviewed - No data to display  EKG None  Radiology Dg Chest 2 View  Result Date: 11/11/2018 CLINICAL DATA:  Cough 1 week EXAM: CHEST - 2 VIEW COMPARISON:  04/26/2018 FINDINGS: Patchy bibasilar infiltrates compatible with pneumonia. No effusion. Lungs were clear previously. Heart size and vascularity normal.  No acute skeletal abnormality IMPRESSION: Bibasilar pneumonia Electronically Signed   By: Marlan Palau M.D.   On: 11/11/2018 10:06    Procedures Procedures (including critical care time)  Medications Ordered in UC Medications - No data to display  Initial Impression / Assessment and Plan / UC Course  I have reviewed the triage vital signs and the nursing notes.  Pertinent labs & imaging results that were available during my care of the patient were reviewed by me and considered in my medical decision making (see chart for details).     Non toxic appearing. No increased work of breathing. Diarrhea has improved but still with nausea, vomiting and loss of appetite. Afebrile. No hypoxia or tachycardia. bilat pna on chest xray today. covid vs CAP vs legionella considered. Will cover for CAP with isolation and out of work precautions provided. Return precautions provided. Patient verbalized understanding and agreeable to plan.  Ambulatory out of clinic without difficulty.    Final Clinical Impressions(s) / UC Diagnoses   Final diagnoses:  Pneumonia of both lower lobes due to infectious organism [facility])      Discharge Instructions     Complete course of antibiotics.  May use previously prescribed tessalon as needed for cough.  Tylenol and/or ibuprofen as needed for pain or fevers.  Please stay out of work for another 1 week.  If develop worsening of shortness of breath , chest pain , weakness, dehydration or otherwise worsening please go to the ER.     ED Prescriptions    Medication Sig Dispense Auth. Provider   amoxicillin (AMOXIL) 500 MG capsule Take 2 capsules (1,000 mg total) by mouth 3 (three) times daily for 7 days. 42 capsule Georgetta Haber, NP  azithromycin (ZITHROMAX) 250 MG tablet Take 2 tablets (500 mg total) by mouth daily for 1 day, THEN 1 tablet (250 mg total) daily for 4 days. 6 tablet Linus MakoBurky, Marwa Fuhrman B, NP   ondansetron (ZOFRAN-ODT) 4 MG disintegrating tablet Take 1 tablet (4 mg total) by mouth every 8 (eight) hours as needed for nausea or vomiting. 12 tablet Georgetta HaberBurky, Alphonsus Doyel B, NP     Controlled Substance Prescriptions [place] Controlled Substance Registry consulted? Not Applicable   Georgetta HaberBurky, Dante Cooter B, NP 11/11/18 1053

## 2019-01-29 IMAGING — CR DG CHEST 1V
1 series · 1 of 1 positions shown · non-contrast
Comparison: None

CLINICAL DATA: Positive TB blood test

EXAM:
CHEST  1 VIEW

[w chest pa]
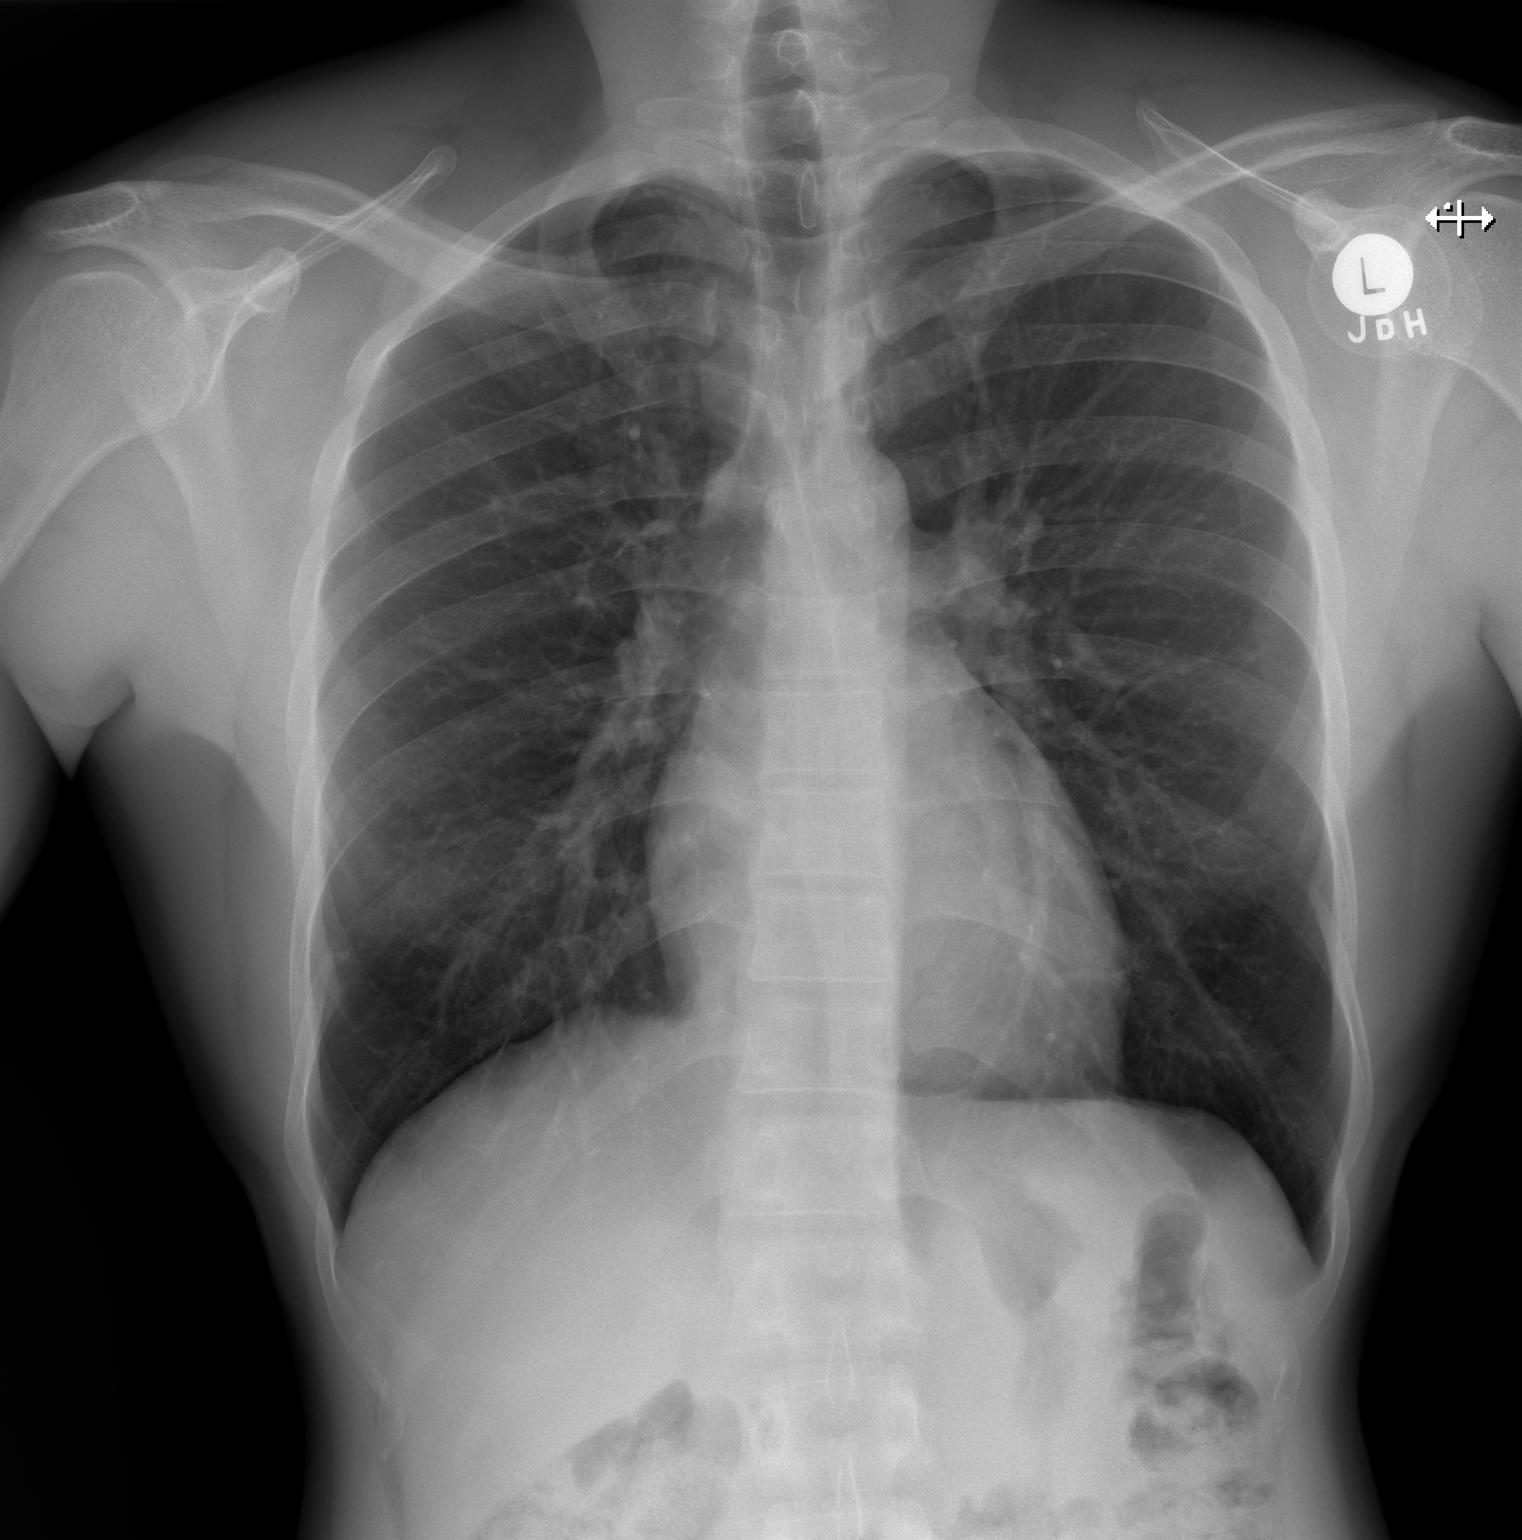

[1 of 1 positions shown; findings below may reference images not displayed]

FINDINGS: Normal heart size, mediastinal contours, and pulmonary vascularity.

Lungs clear.

No pleural effusion or pneumothorax.

Bones unremarkable.
IMPRESSION: Normal exam.

## 2019-08-16 IMAGING — DX CHEST - 2 VIEW
2 series · 2 of 2 positions shown · non-contrast
Comparison: 04/26/2018

CLINICAL DATA: Cough 1 week

EXAM:
CHEST - 2 VIEW

[chest pa]
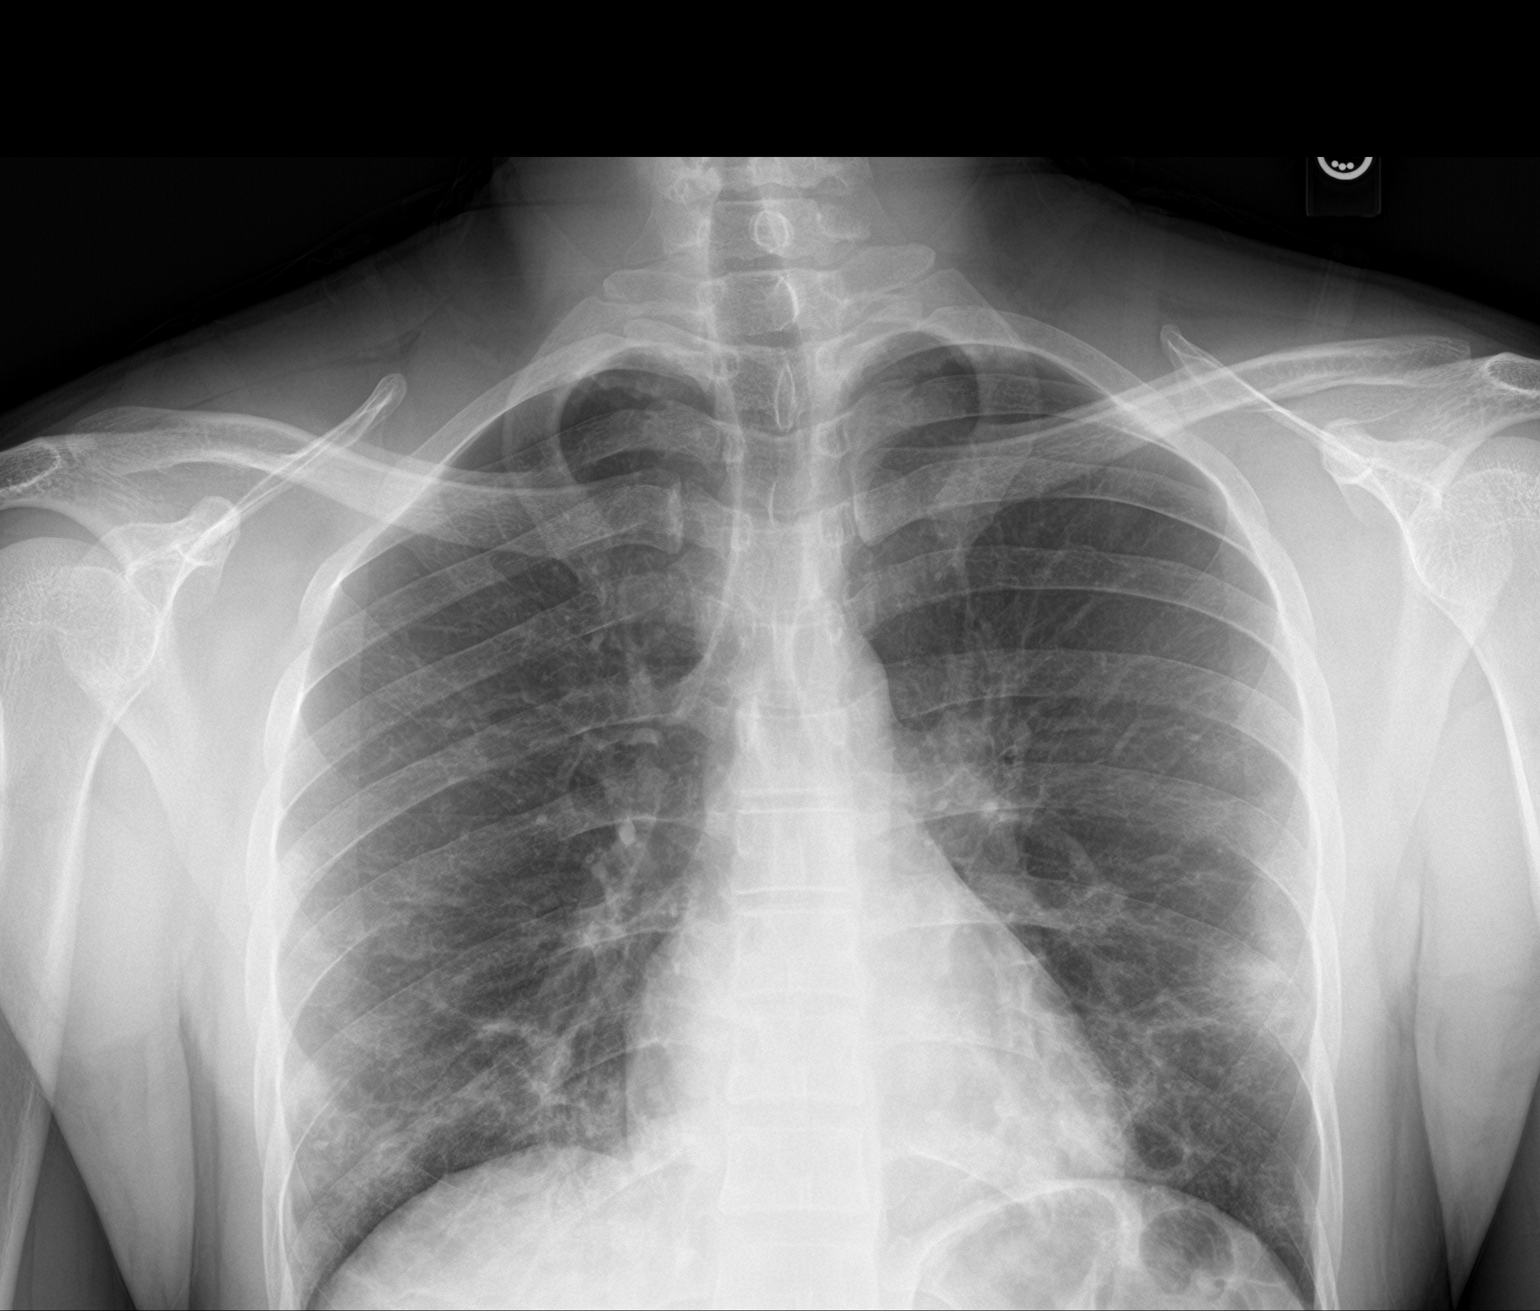

[chest lat]
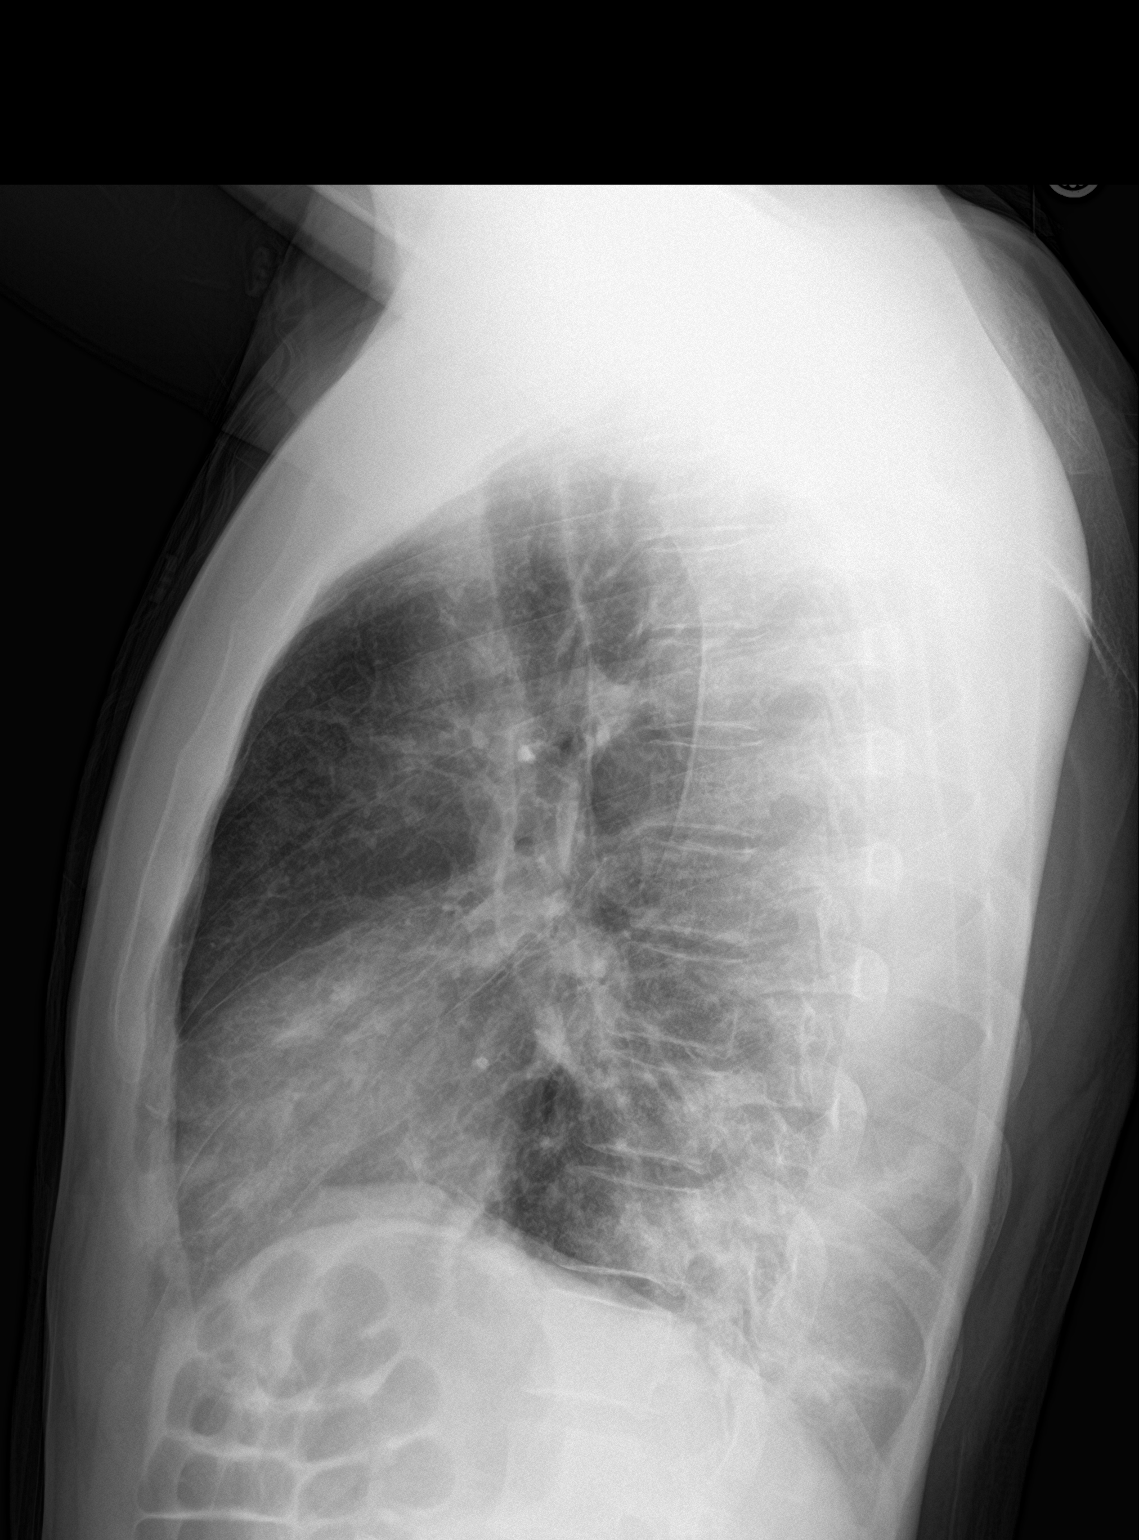

[2 of 2 positions shown; findings below may reference images not displayed]

FINDINGS: Patchy bibasilar infiltrates compatible with pneumonia. No effusion.
Lungs were clear previously.

Heart size and vascularity normal.  No acute skeletal abnormality
IMPRESSION: Bibasilar pneumonia
